# Patient Record
Sex: Male | Born: 1945 | Race: White | Hispanic: No | Marital: Single | Smoking: Former smoker
Health system: Southern US, Community
[De-identification: ages and names within clinical notes are randomized; demographics above are authoritative.]

## PROBLEM LIST (undated history)

## (undated) DIAGNOSIS — N400 Enlarged prostate without lower urinary tract symptoms: Secondary | ICD-10-CM

## (undated) HISTORY — PX: EYE SURGERY: SHX253

## (undated) HISTORY — PX: TONSILLECTOMY: SUR1361

## (undated) HISTORY — PX: CATARACT EXTRACTION: SUR2

---

## 2012-12-12 ENCOUNTER — Encounter (HOSPITAL_COMMUNITY): Payer: Self-pay | Admitting: Pharmacy Technician

## 2012-12-13 ENCOUNTER — Other Ambulatory Visit: Payer: Self-pay

## 2012-12-13 ENCOUNTER — Encounter (HOSPITAL_COMMUNITY)
Admission: RE | Admit: 2012-12-13 | Discharge: 2012-12-13 | Disposition: A | Discharge status: Home / Self Care | Payer: Medicare Other | Source: Ambulatory Visit | Attending: Ophthalmology | Admitting: Ophthalmology

## 2012-12-13 ENCOUNTER — Encounter (HOSPITAL_COMMUNITY): Payer: Self-pay

## 2012-12-13 LAB — BASIC METABOLIC PANEL
CO2: 29 mEq/L (ref 19–32)
Chloride: 101 mEq/L (ref 96–112)
Glucose, Bld: 102 mg/dL — ABNORMAL HIGH (ref 70–99)
Sodium: 139 mEq/L (ref 135–145)

## 2012-12-13 LAB — HEMOGLOBIN AND HEMATOCRIT, BLOOD
HCT: 43.8 % (ref 39.0–52.0)
Hemoglobin: 15.3 g/dL (ref 13.0–17.0)

## 2012-12-13 NOTE — Patient Instructions (Signed)
Your procedure is scheduled on:  Monday, 12/18/12  Report to [facility] at 1000  AM.  Call this number if you have problems the morning of surgery: [phone]   Remember:   Do not eat or drink   After Midnight.  Take these medicines the morning of surgery with A SIP OF WATER: none   Do not wear jewelry, make-up or nail polish.  Do not wear lotions, powders, or perfumes. You may wear deodorant.  Do not bring valuables to the hospital.  Contacts, dentures or bridgework may not be worn into surgery.     Patients discharged the day of surgery will not be allowed to drive home.  Name and phone number of your driver: driver  Special Instructions: Use eye drops as directed.   Please read over the following fact sheets that you were given: Pain Booklet, Anesthesia Post-op Instructions and Care and Recovery After Surgery    Cataract Surgery  A cataract is a clouding of the lens of the eye. When a lens becomes cloudy, vision is reduced based on the degree and nature of the clouding. Surgery may be needed to improve vision. Surgery removes the cloudy lens and usually replaces it with a substitute lens (intraocular lens, IOL). LET YOUR EYE DOCTOR KNOW ABOUT:  Allergies to food or medicine.   Medicines taken including herbs, eyedrops, over-the-counter medicines, and creams.   Use of steroids (by mouth or creams).   Previous problems with anesthetics or numbing medicine.   History of bleeding problems or blood clots.   Previous surgery.   Other health problems, including diabetes and kidney problems.   Possibility of pregnancy, if this applies.  RISKS AND COMPLICATIONS  Infection.   Inflammation of the eyeball (endophthalmitis) that can spread to both eyes (sympathetic ophthalmia).   Poor wound healing.   If an IOL is inserted, it can later fall out of proper position. This is very uncommon.   Clouding of the part of your eye that holds an IOL in place. This is called an  "after-cataract." These are uncommon, but easily treated.  BEFORE THE PROCEDURE  Do not eat or drink anything except small amounts of water for 8 to 12 before your surgery, or as directed by your caregiver.   Unless you are told otherwise, continue any eyedrops you have been prescribed.   Talk to your primary caregiver about all other medicines that you take (both prescription and non-prescription). In some cases, you may need to stop or change medicines near the time of your surgery. This is most important if you are taking blood-thinning medicine.Do not stop medicines unless you are told to do so.   Arrange for someone to drive you to and from the procedure.   Do not put contact lenses in either eye on the day of your surgery.  PROCEDURE There is more than one method for safely removing a cataract. Your doctor can explain the differences and help determine which is best for you. Phacoemulsification surgery is the most common form of cataract surgery.  An injection is given behind the eye or eyedrops are given to make this a painless procedure.   A small cut (incision) is made on the edge of the clear, dome-shaped surface that covers the front of the eye (cornea).   A tiny probe is painlessly inserted into the eye. This device gives off ultrasound waves that soften and break up the cloudy center of the lens. This makes it easier for the cloudy lens  to be removed by suction.   An IOL may be implanted.   The normal lens of the eye is covered by a clear capsule. Part of that capsule is intentionally left in the eye to support the IOL.   Your surgeon may or may not use stitches to close the incision.  There are other forms of cataract surgery that require a larger incision and stiches to close the eye. This approach is taken in cases where the doctor feels that the cataract cannot be easily removed using phacoemulsification. AFTER THE PROCEDURE  When an IOL is implanted, it does not need  care. It becomes a permanent part of your eye and cannot be seen or felt.   Your doctor will schedule follow-up exams to check on your progress.   Review your other medicines with your doctor to see which can be resumed after surgery.   Use eyedrops or take medicine as prescribed by your doctor.  Document Released: 09/30/2011 Document Reviewed: 09/27/2011 [facility] Patient Information 2012 [place], [place].  PATIENT INSTRUCTIONS POST-ANESTHESIA  IMMEDIATELY FOLLOWING SURGERY:  Do not drive or operate machinery for the first twenty four hours after surgery.  Do not make any important decisions for twenty four hours after surgery or while taking narcotic pain medications or sedatives.  If you develop intractable nausea and vomiting or a severe headache please notify your doctor immediately.  FOLLOW-UP:  Please make an appointment with your surgeon as instructed. You do not need to follow up with anesthesia unless specifically instructed to do so.  WOUND CARE INSTRUCTIONS (if applicable):  Keep a dry clean dressing on the anesthesia/puncture wound site if there is drainage.  Once the wound has quit draining you may leave it open to air.  Generally you should leave the bandage intact for twenty four hours unless there is drainage.  If the epidural site drains for more than 36-48 hours please call the anesthesia department.  QUESTIONS?:  Please feel free to call your physician or the hospital operator if you have any questions, and they will be happy to assist you.

## 2012-12-15 MED ORDER — PHENYLEPHRINE HCL 2.5 % OP SOLN
OPHTHALMIC | Status: AC
  Filled 2012-12-15: qty 2

## 2012-12-15 MED ORDER — LIDOCAINE HCL 3.5 % OP GEL
OPHTHALMIC | Status: AC
  Filled 2012-12-15: qty 5

## 2012-12-15 MED ORDER — LIDOCAINE HCL (PF) 1 % IJ SOLN
INTRAMUSCULAR | Status: AC
  Filled 2012-12-15: qty 2

## 2012-12-15 MED ORDER — CYCLOPENTOLATE-PHENYLEPHRINE 0.2-1 % OP SOLN
OPHTHALMIC | Status: AC
  Filled 2012-12-15: qty 2

## 2012-12-15 MED ORDER — TETRACAINE HCL 0.5 % OP SOLN
OPHTHALMIC | Status: AC
  Filled 2012-12-15: qty 2

## 2012-12-15 MED ORDER — NEOMYCIN-POLYMYXIN-DEXAMETH 3.5-10000-0.1 OP OINT
TOPICAL_OINTMENT | OPHTHALMIC | Status: AC
  Filled 2012-12-15: qty 3.5

## 2012-12-18 ENCOUNTER — Encounter (HOSPITAL_COMMUNITY): Payer: Self-pay | Admitting: *Deleted

## 2012-12-18 ENCOUNTER — Ambulatory Visit (HOSPITAL_COMMUNITY)
Admission: RE | Admit: 2012-12-18 | Discharge: 2012-12-18 | Disposition: A | Discharge status: Home Health | Payer: Medicare Other | Source: Ambulatory Visit | Attending: Ophthalmology | Admitting: Ophthalmology

## 2012-12-18 ENCOUNTER — Encounter (HOSPITAL_COMMUNITY): Payer: Self-pay | Admitting: Anesthesiology

## 2012-12-18 ENCOUNTER — Ambulatory Visit (HOSPITAL_COMMUNITY): Payer: Medicare Other | Admitting: Anesthesiology

## 2012-12-18 ENCOUNTER — Encounter (HOSPITAL_COMMUNITY)
Admission: RE | Disposition: A | Discharge status: Home Health | Payer: Self-pay | Source: Ambulatory Visit | Attending: Ophthalmology

## 2012-12-18 DIAGNOSIS — IMO0002 Reserved for concepts with insufficient information to code with codable children: Secondary | ICD-10-CM | POA: Insufficient documentation

## 2012-12-18 DIAGNOSIS — Z01810 Encounter for preprocedural cardiovascular examination: Secondary | ICD-10-CM | POA: Insufficient documentation

## 2012-12-18 DIAGNOSIS — Z01812 Encounter for preprocedural laboratory examination: Secondary | ICD-10-CM | POA: Insufficient documentation

## 2012-12-18 HISTORY — PX: CATARACT EXTRACTION W/PHACO: SHX586

## 2012-12-18 SURGERY — PHACOEMULSIFICATION, CATARACT, WITH IOL INSERTION
Anesthesia: Monitor Anesthesia Care | Site: Eye | Laterality: Right | Wound class: Clean

## 2012-12-18 MED ORDER — CYCLOPENTOLATE-PHENYLEPHRINE 0.2-1 % OP SOLN
1.0000 [drp] | OPHTHALMIC | Status: AC
  Administered 2012-12-18 (×3): 1 [drp] via OPHTHALMIC

## 2012-12-18 MED ORDER — POVIDONE-IODINE 5 % OP SOLN
OPHTHALMIC | Status: DC | PRN
  Administered 2012-12-18: 1 via OPHTHALMIC

## 2012-12-18 MED ORDER — LIDOCAINE HCL 3.5 % OP GEL
1.0000 "application " | Freq: Once | OPHTHALMIC | Status: AC
  Administered 2012-12-18: 1 via OPHTHALMIC

## 2012-12-18 MED ORDER — NEOMYCIN-POLYMYXIN-DEXAMETH 0.1 % OP OINT
TOPICAL_OINTMENT | OPHTHALMIC | Status: DC | PRN
  Administered 2012-12-18: 1 via OPHTHALMIC

## 2012-12-18 MED ORDER — MIDAZOLAM HCL 2 MG/2ML IJ SOLN
INTRAMUSCULAR | Status: AC
  Filled 2012-12-18: qty 2

## 2012-12-18 MED ORDER — BSS IO SOLN
INTRAOCULAR | Status: DC | PRN
  Administered 2012-12-18: 15 mL via INTRAOCULAR

## 2012-12-18 MED ORDER — MIDAZOLAM HCL 2 MG/2ML IJ SOLN
1.0000 mg | INTRAMUSCULAR | Status: DC | PRN
  Administered 2012-12-18: 2 mg via INTRAVENOUS

## 2012-12-18 MED ORDER — NA HYALUR & NA CHOND-NA HYALUR 0.55-0.5 ML IO KIT
PACK | INTRAOCULAR | Status: DC | PRN
  Administered 2012-12-18: 1 via OPHTHALMIC

## 2012-12-18 MED ORDER — EPINEPHRINE HCL 1 MG/ML IJ SOLN
INTRAMUSCULAR | Status: AC
  Filled 2012-12-18: qty 1

## 2012-12-18 MED ORDER — PHENYLEPHRINE HCL 2.5 % OP SOLN
1.0000 [drp] | OPHTHALMIC | Status: AC
  Administered 2012-12-18 (×3): 1 [drp] via OPHTHALMIC

## 2012-12-18 MED ORDER — TRYPAN BLUE 0.06 % OP SOLN
OPHTHALMIC | Status: AC
  Filled 2012-12-18: qty 0.5

## 2012-12-18 MED ORDER — LIDOCAINE HCL (PF) 1 % IJ SOLN
INTRAMUSCULAR | Status: DC | PRN
  Administered 2012-12-18: .6 mL

## 2012-12-18 MED ORDER — EPINEPHRINE HCL 1 MG/ML IJ SOLN
INTRAOCULAR | Status: DC | PRN
  Administered 2012-12-18: 12:00:00

## 2012-12-18 MED ORDER — LIDOCAINE 3.5 % OP GEL OPTIME - NO CHARGE
OPHTHALMIC | Status: DC | PRN
  Administered 2012-12-18: 1 [drp] via OPHTHALMIC

## 2012-12-18 MED ORDER — LACTATED RINGERS IV SOLN
INTRAVENOUS | Status: DC
  Administered 2012-12-18: 1000 mL via INTRAVENOUS

## 2012-12-18 MED ORDER — TETRACAINE HCL 0.5 % OP SOLN
1.0000 [drp] | OPHTHALMIC | Status: AC
  Administered 2012-12-18 (×3): 1 [drp] via OPHTHALMIC

## 2012-12-18 SURGICAL SUPPLY — 33 items
CAPSULAR TENSION RING-AMO (OPHTHALMIC RELATED) IMPLANT
CLOTH BEACON ORANGE TIMEOUT ST (SAFETY) ×2 IMPLANT
EYE SHIELD UNIVERSAL CLEAR (GAUZE/BANDAGES/DRESSINGS) ×2 IMPLANT
GLOVE BIO SURGEON STRL SZ 6.5 (GLOVE) IMPLANT
GLOVE BIOGEL PI IND STRL 6.5 (GLOVE) ×1 IMPLANT
GLOVE BIOGEL PI IND STRL 7.0 (GLOVE) IMPLANT
GLOVE BIOGEL PI IND STRL 7.5 (GLOVE) IMPLANT
GLOVE BIOGEL PI INDICATOR 6.5 (GLOVE) ×1
GLOVE BIOGEL PI INDICATOR 7.0 (GLOVE)
GLOVE BIOGEL PI INDICATOR 7.5 (GLOVE)
GLOVE ECLIPSE 6.5 STRL STRAW (GLOVE) IMPLANT
GLOVE ECLIPSE 7.0 STRL STRAW (GLOVE) IMPLANT
GLOVE ECLIPSE 7.5 STRL STRAW (GLOVE) IMPLANT
GLOVE EXAM NITRILE LRG STRL (GLOVE) IMPLANT
GLOVE EXAM NITRILE MD LF STRL (GLOVE) IMPLANT
GLOVE SKINSENSE NS SZ6.5 (GLOVE)
GLOVE SKINSENSE NS SZ7.0 (GLOVE)
GLOVE SKINSENSE NS SZ7.5 (GLOVE) ×1
GLOVE SKINSENSE STRL SZ6.5 (GLOVE) IMPLANT
GLOVE SKINSENSE STRL SZ7.0 (GLOVE) IMPLANT
GLOVE SKINSENSE STRL SZ7.5 (GLOVE) ×1 IMPLANT
KIT VITRECTOMY (OPHTHALMIC RELATED) IMPLANT
PAD ARMBOARD 7.5X6 YLW CONV (MISCELLANEOUS) ×2 IMPLANT
PROC W NO LENS (INTRAOCULAR LENS)
PROC W SPEC LENS (INTRAOCULAR LENS)
PROCESS W NO LENS (INTRAOCULAR LENS) IMPLANT
PROCESS W SPEC LENS (INTRAOCULAR LENS) IMPLANT
RING MALYGIN (MISCELLANEOUS) IMPLANT
SIGHTPATH CAT PROC W REG LENS (Ophthalmic Related) ×2 IMPLANT
SYR TB 1ML LL NO SAFETY (SYRINGE) ×2 IMPLANT
TAPE CLOTH SOFT 2X10 (GAUZE/BANDAGES/DRESSINGS) ×2 IMPLANT
VISCOELASTIC ADDITIONAL (OPHTHALMIC RELATED) IMPLANT
WATER STERILE IRR 250ML POUR (IV SOLUTION) ×2 IMPLANT

## 2012-12-18 NOTE — Anesthesia Preprocedure Evaluation (Signed)
Anesthesia Evaluation  Patient identified by MRN, date of birth, ID band Patient awake    Reviewed: Allergy & Precautions, H&P , NPO status , Patient's Chart, lab work & pertinent test results  Airway Mallampati: III    Mouth opening: Limited Mouth Opening  Dental  (+) Edentulous Upper and Edentulous Lower   Pulmonary neg pulmonary ROS, former smoker,  breath sounds clear to auscultation        Cardiovascular negative cardio ROS  Rhythm:Regular Rate:Normal     Neuro/Psych    GI/Hepatic   Endo/Other    Renal/GU      Musculoskeletal   Abdominal   Peds  Hematology   Anesthesia Other Findings   Reproductive/Obstetrics                           Anesthesia Physical Anesthesia Plan  ASA: I  Anesthesia Plan: MAC   Post-op Pain Management:    Induction: Intravenous  Airway Management Planned: Nasal Cannula  Additional Equipment:   Intra-op Plan:   Post-operative Plan:   Informed Consent: I have reviewed the patients History and Physical, chart, labs and discussed the procedure including the risks, benefits and alternatives for the proposed anesthesia with the patient or authorized representative who has indicated his/her understanding and acceptance.     Plan Discussed with:   Anesthesia Plan Comments:         Anesthesia Quick Evaluation

## 2012-12-18 NOTE — Transfer of Care (Signed)
Immediate Anesthesia Transfer of Care Note  Patient: Cory Porter  Procedure(s) Performed: Procedure(s) (LRB): CATARACT EXTRACTION PHACO AND INTRAOCULAR LENS PLACEMENT (IOC) (Right)  Patient Location: Shortstay  Anesthesia Type: MAC  Level of Consciousness: awake  Airway & Oxygen Therapy: Patient Spontanous Breathing   Post-op Assessment: Report given to PACU RN, Post -op Vital signs reviewed and stable and Patient moving all extremities  Post vital signs: Reviewed and stable  Complications: No apparent anesthesia complications

## 2012-12-18 NOTE — Anesthesia Postprocedure Evaluation (Signed)
  Anesthesia Post-op Note  Patient: Cory Porter  Procedure(s) Performed: Procedure(s) (LRB): CATARACT EXTRACTION PHACO AND INTRAOCULAR LENS PLACEMENT (IOC) (Right)  Patient Location:  Short Stay  Anesthesia Type: MAC  Level of Consciousness: awake  Airway and Oxygen Therapy: Patient Spontanous Breathing  Post-op Pain: none  Post-op Assessment: Post-op Vital signs reviewed, Patient's Cardiovascular Status Stable, Respiratory Function Stable, Patent Airway, No signs of Nausea or vomiting and Pain level controlled  Post-op Vital Signs: Reviewed and stable  Complications: No apparent anesthesia complications

## 2012-12-18 NOTE — H&P (Signed)
I have reviewed the H&P, the patient was re-examined, and I have identified no interval changes in medical condition and plan of care since the history and physical of record  

## 2012-12-18 NOTE — Anesthesia Procedure Notes (Signed)
Procedure Name: MAC Date/Time: 12/18/2012 11:28 AM Performed by: Franco Nones Pre-anesthesia Checklist: Patient identified, Emergency Drugs available, Suction available, Timeout performed and Patient being monitored Patient Re-evaluated:Patient Re-evaluated prior to inductionOxygen Delivery Method: Nasal Cannula

## 2012-12-18 NOTE — Brief Op Note (Signed)
Pre-Op Dx: Cataract OD Post-Op Dx: Cataract OD Surgeon: Gwendolyn Mclees Anesthesia: Topical with MAC Surgery: Cataract Extraction with Intraocular lens Implant OD Implant: B&L enVista Specimen: None Complications: None 

## 2012-12-19 ENCOUNTER — Other Ambulatory Visit (HOSPITAL_COMMUNITY): Payer: Self-pay

## 2012-12-19 ENCOUNTER — Encounter (HOSPITAL_COMMUNITY): Payer: Self-pay | Admitting: Ophthalmology

## 2012-12-19 NOTE — Op Note (Signed)
NAMEQUASEAN, FRYE                 ACCOUNT NO.:  [id number]  MEDICAL RECORD NO.:  [id number]  LOCATION:  [facility]                          FACILITY:  [facility]  PHYSICIAN:  Susanne Greenhouse, MD       DATE OF BIRTH:  [birth year]  DATE OF PROCEDURE:  12/18/2012 DATE OF DISCHARGE:  12/18/2012                              OPERATIVE REPORT   PREOPERATIVE DIAGNOSIS:  Matured cataract, right eye, diagnosis code 366.17.  POSTOPERATIVE DIAGNOSIS:  Matured cataract, right eye, diagnosis code 366.17.  PROCEDURE:  Phacoemulsification with posterior chamber intraocular lens implantation, right eye.  SURGEON:  Bonne Dolores. Farhad Burleson, MD  ANESTHESIA:  Topical with monitored anesthesia care and IV sedation.  DESCRIPTION OF THE PROCEDURE:  In the preoperative holding area, dilating drops and Viscous lidocaine were placed into the right eye. The patient was then brought to the operating room where he was prepped and draped.  Beginning with a 75 blade, a paracentesis was made at the surgeon's 2 o'clock position.  The anterior chamber was then filled with a 1% nonpreserved lidocaine solution.  This was followed by filling the anterior chamber with VisionBlue to stay in the anterior capsule due to a poor red reflex.  The VisionBlue was displaced from the anterior chamber with balanced salt solution on a fine cannula.  The anterior chamber was then filled with Viscoat.  A 2.4-mm keratome blade was then used to make a clear corneal incision at the temporal limbus.  A bent cystotome needle and Utrata forceps were used to create a continuous tear capsulotomy.  Hydrodissection was performed with balanced salt solution on a fine cannula.  The lens nucleus was then removed using phacoemulsification in a quadrant cracking technique.  Residual cortex was removed with the irrigation and aspiration handpiece.  The capsular bag and anterior chamber were refilled with Provisc and the posterior chamber intraocular lens was placed  into the capsular bag without difficulty using the lens injecting system.  The Provisc was removed from the capsular bag and anterior chamber with the irrigation and aspiration handpiece.  Stromal hydration of the main incision and paracentesis ports was performed with balanced salt solution and a fine cannula.  The wounds were tested for leak, which were negative.  The patient tolerated the procedure well.  There were no operative complications and he was returned to the recovery area in satisfactory condition.  No surgical specimens.  Prosthetic device used is a Theme park manager, model EnVista, model number MX60, power of 23.0, serial number is [id number].          ______________________________ Susanne Greenhouse, MD     KEH/MEDQ  D:  12/18/2012  T:  12/19/2012  Job:  [id number]

## 2016-12-12 ENCOUNTER — Encounter (HOSPITAL_COMMUNITY): Payer: Self-pay | Admitting: General Practice

## 2016-12-12 ENCOUNTER — Observation Stay (HOSPITAL_COMMUNITY)
Admission: AD | Admit: 2016-12-12 | Discharge: 2016-12-12 | Disposition: A | Discharge status: Home / Self Care | Payer: Medicare Other | Source: Other Acute Inpatient Hospital | Attending: Family Medicine | Admitting: Family Medicine

## 2016-12-12 ENCOUNTER — Observation Stay (HOSPITAL_BASED_OUTPATIENT_CLINIC_OR_DEPARTMENT_OTHER): Payer: Medicare Other

## 2016-12-12 ENCOUNTER — Telehealth: Payer: Self-pay | Admitting: Internal Medicine

## 2016-12-12 DIAGNOSIS — R739 Hyperglycemia, unspecified: Secondary | ICD-10-CM | POA: Diagnosis present

## 2016-12-12 DIAGNOSIS — R079 Chest pain, unspecified: Secondary | ICD-10-CM | POA: Diagnosis present

## 2016-12-12 DIAGNOSIS — Z91040 Latex allergy status: Secondary | ICD-10-CM | POA: Diagnosis not present

## 2016-12-12 DIAGNOSIS — Z87891 Personal history of nicotine dependence: Secondary | ICD-10-CM

## 2016-12-12 DIAGNOSIS — R0789 Other chest pain: Principal | ICD-10-CM | POA: Insufficient documentation

## 2016-12-12 DIAGNOSIS — N400 Enlarged prostate without lower urinary tract symptoms: Secondary | ICD-10-CM | POA: Diagnosis present

## 2016-12-12 DIAGNOSIS — I252 Old myocardial infarction: Secondary | ICD-10-CM | POA: Insufficient documentation

## 2016-12-12 HISTORY — DX: Benign prostatic hyperplasia without lower urinary tract symptoms: N40.0

## 2016-12-12 LAB — TROPONIN I: Troponin I: <0.03 ng/mL (ref <0.03)

## 2016-12-12 LAB — LIPID PANEL
CHOL/HDL RATIO: 5.2 ratio
CHOLESTEROL: 202 mg/dL — AB (ref 0–200)
HDL: 39 mg/dL — ABNORMAL LOW (ref >40)
LDL CALC: 150 mg/dL — AB (ref 0–99)
Triglycerides: 66 mg/dL (ref <150)
VLDL: 13 mg/dL (ref 0–40)

## 2016-12-12 LAB — NM MYOCAR MULTI W/SPECT W/WALL MOTION / EF
CHL CUP MPHR: 149 {beats}/min
CHL CUP RESTING HR STRESS: 76 {beats}/min
CHL RATE OF PERCEIVED EXERTION: 15
CSEPEW: 5.8 METS
Exercise duration (min): 4 min
Exercise duration (sec): 0 s
Peak HR: 151 {beats}/min
Percent HR: 101 %

## 2016-12-12 LAB — TSH: TSH: 2.764 u[IU]/mL (ref 0.350–4.500)

## 2016-12-12 MED ORDER — ASPIRIN 325 MG PO TABS
325.0000 mg | ORAL_TABLET | Freq: Every day | ORAL | Status: DC
  Administered 2016-12-12: 325 mg via ORAL
  Filled 2016-12-12: qty 1

## 2016-12-12 MED ORDER — TECHNETIUM TC 99M TETROFOSMIN IV KIT
30.0000 | PACK | Freq: Once | INTRAVENOUS | Status: AC | PRN
  Administered 2016-12-12: 30 via INTRAVENOUS

## 2016-12-12 MED ORDER — DIPHENHYDRAMINE-APAP (SLEEP) 25-500 MG PO TABS: 1.0000 | ORAL_TABLET | Freq: Every day | ORAL | Status: DC

## 2016-12-12 MED ORDER — REGADENOSON 0.4 MG/5ML IV SOLN
INTRAVENOUS | Status: AC
  Filled 2016-12-12: qty 5

## 2016-12-12 MED ORDER — REGADENOSON 0.4 MG/5ML IV SOLN
0.4000 mg | Freq: Once | INTRAVENOUS | Status: DC
  Filled 2016-12-12: qty 5

## 2016-12-12 MED ORDER — ONDANSETRON HCL 4 MG/2ML IJ SOLN: 4.0000 mg | Freq: Four times a day (QID) | INTRAMUSCULAR | Status: DC | PRN

## 2016-12-12 MED ORDER — ENOXAPARIN SODIUM 40 MG/0.4ML ~~LOC~~ SOLN
40.0000 mg | SUBCUTANEOUS | Status: DC
  Administered 2016-12-12: 40 mg via SUBCUTANEOUS
  Filled 2016-12-12: qty 0.4

## 2016-12-12 MED ORDER — ACETAMINOPHEN 325 MG PO TABS: 650.0000 mg | ORAL_TABLET | ORAL | Status: DC | PRN

## 2016-12-12 MED ORDER — SODIUM CHLORIDE 0.9 % IV SOLN: INTRAVENOUS | Status: DC

## 2016-12-12 MED ORDER — DIPHENHYDRAMINE HCL 25 MG PO CAPS: 25.0000 mg | ORAL_CAPSULE | Freq: Every day | ORAL | Status: DC

## 2016-12-12 NOTE — Consult Note (Addendum)
    Chief Complaint/Reason for Consult: chest pain   PCP:  Default, Provider, MD Primary Cardiologist:N/A  HPI:  71 year old with no history of CAD [place].  Acutely tonight had acute onset of back/chest pain central at rest without radiation.  Associated with nausea, diaphoresis, shortness of breath.  Symptoms improved in 30 minutes.  [facility] and ECG reported as within normal limits other than possible repolarization. Troponin negative x2.  [facility].    No known CAD, possibly stress test [place] [facility] [place] [place] ago (unclear) which was normal.  No previous cath or echo.  Doesn't go to the doctor regularly.  No home medications other than Flomax.    On questioning, chest pain free.  He denies any recent minor chest pains or symptoms with exertion.  Denies reflux symptoms. No edema, SOB, bleeding.    Remote smoking history and family history of early CAD in brother.  Rhunette CroftMildred (girlfriend) and daughter at bedside and deny any other symptoms, changes.  History reviewed. No pertinent past medical history. BPH  Past Surgical History:  Procedure Laterality Date  . CATARACT EXTRACTION     left-[place]  . CATARACT EXTRACTION W/PHACO Right 12/18/2012   Procedure: CATARACT EXTRACTION PHACO AND INTRAOCULAR LENS PLACEMENT (IOC);  Surgeon: Gemma PayorKerry Hunt, MD;  Location: [facility];  Service: Ophthalmology;  Laterality: Right;  CDE:37.10  . EYE SURGERY     cataract  . TONSILLECTOMY      Early CAD in brother  Social History:  reports that he has quit smoking. His smoking use included Cigarettes. He has a 30.00 pack-year smoking history. He quit smokeless tobacco use about 34 years ago. He reports that he does not drink alcohol or use drugs.  Allergies:  Allergies  Allergen Reactions  . Latex Rash    No current facility-administered medications on file prior to encounter.    Current  Outpatient Prescriptions on File Prior to Encounter  Medication Sig Dispense Refill  . diphenhydramine-acetaminophen (TYLENOL PM) 25-500 MG TABS Take 1 tablet by mouth at bedtime.      ECG/TELE: Tele: NSR nl; ECG sinus with nl axis, intervals, segments.  Lowish voltage  ROS: As above. Otherwise, review of systems is negative unless per above HPI  Vitals:   12/12/16 0605  BP: 133/77  Pulse: 70  Resp: 19  Temp: 98.4 F (36.9 C)  TempSrc: Oral  SpO2: 98%  Weight: 87 kg (191 lb 14.4 oz)  Height: 5\' 11"  (1.803 m)   Wt Readings from Last 10 Encounters:  12/12/16 87 kg (191 lb 14.4 oz)  12/13/12 94.3 kg (208 lb)    PE:  General: No acute distress HEENT: Atraumatic, EOMI, mucous membranes moist CV: RRR, 1/6 SEM, no gallops. No JVD at 45 degrees. No HJR. Respiratory: Clear, no crackles. Normal work of breathing ABD: Non-distended and non-tender. No palpable organomegaly.  Extremities: 2+ radial pulses bilaterally. No edema. Neuro/Psych: CN grossly intact, alert and oriented  Assessment/Plan Chest Pain BPH  Recommendations: Chest pain story with some classic features and previous tobacco abuse and early CAD in brother.  Reasonable to pursue stress testing pending results from labs/tests.  - Cycle enzymes, Telemetry, continue ASA 81 mg daily , daily ECG - Check lipids, TSH, A1c to better understand risk - Full echocardiogram - Heparin if repeat symptoms  Greig CastillaGregory Thomas Means  MD 12/12/2016, 6:31 AM

## 2016-12-12 NOTE — H&P (Signed)
Transfer from [facility] Mr. Cory IdolFoley is a 71 year old male with no significant known pmh who presented with acute onset of CP around 10:30 PM last night. Symptoms lasted 30 minutes and self resolved. Vital signs stable. Initial troponins 2 negative and EKG unchanged. Transferring for need of stress testing and cardiology eval. Cardiology consulted prior to transport talked with fellow on-call. Admitted observation to a telemetry bed.

## 2016-12-12 NOTE — H&P (Signed)
History and Physical    Cory Porter XLK:[id number] DOB: June 04, 1946 DOA: 12/12/2016   PCP: Default, Provider, MD   Patient coming from/Resides with: Private residence/this with wife  Admission status: Observation/telemetry -it may be medically necessary to stay a minimum 2 midnights to rule out impending and/or unexpected changes in physiologic status that may differ from initial evaluation performed in the ER and/or at time of admission therefore consider reevaluation of admission status in 24 hours.   Chief Complaint: Chest pain  HPI: Cory Porter is a 71 y.o. male with medical history significant for tobacco abuse, BPH and also family history of MI. Patient reports that around 9 AM his back began hurting and he leaned forward and developed diffuse anterior chest pain that continued to radiate back and forth between the back and chest. He initially thought this was heartburn but was unable to relieve the symptoms. Symptoms were associated with diaphoresis and lasted 15 minutes and resolved spontaneously. Patient reports that he is typically very active and walks daily including walking his dog and has not had chest pain, increasing fatigue or shortness of breath with activity.  ED Course: [facility]) Vital Signs: BP 133/69   Pulse 61   Temp 98.3 F  (Oral)   Resp 19   Ht 5\' 11"  (1.803 m)   Wt 87 kg (191 lb 14.4 oz)   SpO2 98%   BMI 26.76 kg/m  PCXR: neg Lab data: WBC 7600, hemoglobin 14.1, platelets 190,000, normal differential, BNP 44, d-dimer 0.34, glucose 174, BUN 14, creatinine 0.98, LFTs normal, sodium 142, potassium 4, chloride 103, troponin less than 0.012 collections Medications and treatments: Nitroglycerin paste 1/2 inch 1, Tylenol 975 mg 1, aspirin 162 mg 1  Review of Systems:  In addition to the HPI above,  No Fever-chills, myalgias or other constitutional symptoms No Headache, changes with Vision or hearing, new weakness, tingling, numbness in any  extremity, dizziness, dysarthria or word finding difficulty, gait disturbance or imbalance, tremors or seizure activity No problems swallowing food or Liquids, indigestion/reflux, choking or coughing while eating, abdominal pain with or after eating No Cough or Shortness of Breath, palpitations, orthopnea or DOE No Abdominal pain, N/V, melena,hematochezia, dark tarry stools, constipation No dysuria, malodorous urine, hematuria or flank pain No new skin rashes, lesions, masses or bruises, No new joint pains, aches, swelling or redness No recent unintentional weight gain or loss No polyuria, polydypsia or polyphagia   Past Medical History:  Diagnosis Date  . BPH (benign prostatic hyperplasia)     Past Surgical History:  Procedure Laterality Date  . CATARACT EXTRACTION     left-  . CATARACT EXTRACTION W/PHACO Right 12/18/2012   Procedure: CATARACT EXTRACTION PHACO AND INTRAOCULAR LENS PLACEMENT (IOC);  Surgeon: Gemma Payor, MD;  Location: [facility];  Service: Ophthalmology;  Laterality: Right;  CDE:37.10  . EYE SURGERY     cataract  . TONSILLECTOMY      Social History   Social History  . Marital status: Single    Spouse name: N/A  . Number of children: N/A  . Years of education: N/A   Occupational History  . Not on file.   Social History Main Topics  . Smoking status: Former Smoker    Packs/day: 2.00    Years: 15.00    Types: Cigarettes  . Smokeless tobacco: Former Neurosurgeon    Quit date: 12/13/1989  . Alcohol use No  . Drug use: No  . Sexual activity: Yes    Birth control/  protection: None   Other Topics Concern  . Not on file   Social History Narrative  . 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 Latex Rash    Family History  Problem Relation Age of Onset  . CAD Father 3262  . Heart  attack Father   . CAD Brother 8851     Prior to Admission medications   Medication Sig Start Date End Date Taking? Authorizing Provider  diphenhydramine-acetaminophen (TYLENOL PM) 25-500 MG TABS Take 1 tablet by mouth at bedtime.    Historical Provider, MD    Physical Exam: Vitals:   12/12/16 0605  BP: 133/77  Pulse: 70  Resp: 19  Temp: 98.4 F (36.9 C)  TempSrc: Oral  SpO2: 98%  Weight: 87 kg (191 lb 14.4 oz)  Height: 5\' 11"  (1.803 m)      Constitutional: NAD, calm, comfortable Eyes: PERRL, lids and conjunctivae normal ENMT: Mucous membranes are moist. Posterior pharynx clear of any exudate or lesions.Normal dentition.  Neck: normal, supple, no masses, no thyromegaly Respiratory: clear to auscultation bilaterally, no wheezing, no crackles. Normal respiratory effort. No accessory muscle use.  Cardiovascular: Regular rate and rhythm, no murmurs / rubs / gallops. No extremity edema. 2+ pedal pulses. No carotid bruits.  Abdomen: no tenderness, no masses palpated. No hepatosplenomegaly. Bowel sounds positive.  Musculoskeletal: no clubbing / cyanosis. No joint deformity upper and lower extremities. Good ROM, no contractures. Normal muscle tone.  Skin: no rashes, lesions, ulcers. No induration Neurologic: CN 2-12 grossly intact. Sensation intact, DTR normal. Strength 5/5 x all 4 extremities.  Psychiatric: Normal judgment and insight. Alert and oriented x 3. Normal mood.    Labs on Admission: I have personally reviewed following labs and imaging studies  CBC: No results for input(s): WBC, NEUTROABS, HGB, HCT, MCV, PLT in the last 168 hours. Basic Metabolic Panel: No results for input(s): NA, K, CL, CO2, GLUCOSE, BUN, CREATININE, CALCIUM, MG, PHOS in the last 168 hours. GFR: CrCl cannot be calculated (Patient's most recent lab result is older than the maximum 21 days allowed.). Liver Function Tests: No results for input(s): AST, ALT, ALKPHOS, BILITOT, PROT, ALBUMIN in the last  168 hours. No results for input(s): LIPASE, AMYLASE in the last 168 hours. No results for input(s): AMMONIA in the last 168 hours. Coagulation Profile: No results for input(s): INR, PROTIME in the last 168 hours. Cardiac Enzymes: No results for input(s): CKTOTAL, CKMB, CKMBINDEX, TROPONINI in the last 168 hours. BNP (last 3 results) No results for input(s): PROBNP in the last 8760 hours. HbA1C: No results for input(s): HGBA1C in the last 72 hours. CBG: No results for input(s): GLUCAP in the last 168 hours. Lipid Profile: No results for input(s): CHOL, HDL, LDLCALC, TRIG, CHOLHDL, LDLDIRECT in the last 72 hours. Thyroid Function Tests: No results for input(s): TSH, T4TOTAL, FREET4, T3FREE, THYROIDAB in the last 72 hours. Anemia Panel: No results for input(s): VITAMINB12, FOLATE, FERRITIN, TIBC, IRON, RETICCTPCT in the last 72 hours. Urine analysis: No results found for: COLORURINE, APPEARANCEUR, LABSPEC, PHURINE, GLUCOSEU, HGBUR, BILIRUBINUR, KETONESUR, PROTEINUR, UROBILINOGEN, NITRITE, LEUKOCYTESUR Sepsis Labs: @LABRCNTIP (procalcitonin:4,lacticidven:4) )No results found for this or any previous visit (from the past 240 hour(s)).   Radiological Exams on Admission: No results found.  EKG: (Independently reviewed) sinus rhythm with her to cardiac ventricular rate 56 bpm, QTC 419 ms,  normal R-wave progression, loss of R waves in leads 3 and aVF, slightly widened QRS without definitive interventricular conduction delay, no definitive ischemic changes; unchanged from previous EKG from 2014  Assessment/Plan Principal Problem:   Chest pain -Results with solitary episode of chest pain associated with diaphoresis, onset at rest with spontaneous improvement -Heart score equals 3 -Family history of heart disease in father/nonsmoker and Brother/smoker -Reported negative stress test to be a system about 5 years ago -Troponin 2 negative-repeat at least once -Have discussed with cardiology PA  (Bhagat) and plans are to proceed with NM Lexiscan stress test-cardiology has seen in consultation -Echocardiogram -Lipid panel -Currently chest pain-free and has had no recurrences of chest pain since initial presentation to outside ER therefore no indication to begin heparin infusion  Active Problems:   Acute hyperglycemia -Hemoglobin A1c    BPH (benign prostatic hyperplasia) -Reports takes Flomax at home    Former tobacco use -Cessation 1991      DVT prophylaxis: Lovenox Code Status: Full  Family Communication: Wife and daughter at bedside Disposition Plan: Discharge to preadmission home environment when medically stable Consults called: Cardiology/Nishan    Cory Porter L. ANP-BC [facility] Pager [phone]   If 7PM-7AM, please contact night-coverage [email or web address] Password [facility]  12/12/2016, 7:38 AM

## 2016-12-12 NOTE — Discharge Summary (Signed)
Physician Discharge Summary  Cory Deutscherdwin D Tornow [id number] DOB: 10-30-1945 DOA: 12/12/2016  PCP: Default, Provider, MD  Admit date: 12/12/2016 Discharge date: 12/12/2016  Time spent: <30 minutes  Recommendations for Outpatient Follow-up:  1. Please schedule post hospital [facility] 2. You had an abnormal lipid panel with cholesterol 202, HDL cholesterol 39 and LDL cholesterol 150 that may require the initiation of medications. These results need to be discussed with your primary care provider 3. Patient presented with atypical rest chest pain with a negative nuclear stress test. If pain recurs patient may need additional testing for likely gastrointestinal etiology.   Discharge Diagnoses:  Principal Problem:   Chest pain Active Problems:   Acute hyperglycemia   BPH (benign prostatic hyperplasia)   Former tobacco use   Discharge Condition: Stable  Diet recommendation: Heart healthy  Filed Weights   12/12/16 0605  Weight: 87 kg (191 lb 14.4 oz)    History of present illness:  Cory Porter is a 71 y.o. male with medical history significant for tobacco abuse, BPH and also family history of MI. Patient reports that around 9 AM his back began hurting and he leaned forward and developed diffuse anterior chest pain that continued to radiate back and forth between the back and chest. He initially thought this was heartburn but was unable to relieve the symptoms. Symptoms were associated with diaphoresis and lasted 15 minutes and resolved spontaneously. Patient reports that he is typically very active and walks daily including walking his dog and has not had chest pain, increasing fatigue or shortness of breath with activity.  Hospital Course:  Principal Problem:   Chest pain -Presented with solitary episode of chest pain associated with diaphoresis, onset at rest with spontaneous improvement -Heart score equals 3 -Family  history of heart disease in father/nonsmoker and Brother/smoker -Reported negative stress test to be a system about 5 years ago -Troponin I negative for for readings -Cardiology consulted; Myoview stress test recommended with subsequent nonischemic results -Echocardiogram ordered but canceled in setting of negative Myoview stress test -Lipid panel abnormal as follows: Cholesterol 202, HDL cholesterol 39, LDL cholesterol 150 and triglycerides 66. -As remained chest pain-free-has had no recurrences of chest pain since initial presentation to outside ER  -TSH was 2.764  Active Problems:   Acute hyperglycemia -Hemoglobin A1c obtained and pending results at time of discharge    BPH (benign prostatic hyperplasia) -Resume Flomax at home    Former tobacco use -Cessation since 1991  Procedures: None  Consultations:  Cardiology/Dr. Eden EmmsNishan  Discharge Exam: Vitals:   12/12/16 0919 12/12/16 0922  BP: (!) 164/76 (!) 155/76  Pulse: (!) 118 (!) 109  Resp:    Temp:     Constitutional: NAD, calm, comfortable Eyes: PERRL, lids and conjunctivae normal ENMT: Mucous membranes are moist. Posterior pharynx clear of any exudate or lesions.Normal dentition.  Neck: normal, supple, no masses, no thyromegaly Respiratory: clear to auscultation bilaterally, no wheezing, no crackles. Normal respiratory effort. No accessory muscle use.  Cardiovascular: Regular rate and rhythm, no murmurs / rubs / gallops. No extremity edema. 2+ pedal pulses. No carotid bruits.  Abdomen: no tenderness, no masses palpated. No hepatosplenomegaly. Bowel sounds positive.  Musculoskeletal: no clubbing / cyanosis. No joint deformity upper and lower extremities. Good ROM, no contractures. Normal muscle tone.  Skin: no rashes, lesions, ulcers. No induration Neurologic: CN 2-12 grossly intact. Sensation intact, DTR normal. Strength 5/5 x all 4 extremities.  Psychiatric: Normal judgment and  insight. Alert and oriented x 3. Normal  mood.     Discharge Instructions   Discharge Instructions    Call MD for:    Complete by:  As directed    Chest pain or abdomen pain   Call MD for:  difficulty breathing, headache or visual disturbances    Complete by:  As directed    Call MD for:  extreme fatigue    Complete by:  As directed    Call MD for:  persistant nausea and vomiting    Complete by:  As directed    Diet - low sodium heart healthy    Complete by:  As directed    Increase activity slowly    Complete by:  As directed      Current Discharge Medication List    CONTINUE these medications which have NOT CHANGED   Details  diphenhydramine-acetaminophen (TYLENOL PM) 25-500 MG TABS Take 1 tablet by mouth at bedtime.       Allergies  Allergen Reactions  . Latex Rash      The results of significant diagnostics from this hospitalization (including imaging, microbiology, ancillary and laboratory) are listed below for reference.    Significant Diagnostic Studies: Nm Myocar Multi W/spect W/wall Motion / Ef  Result Date: 12/12/2016 CLINICAL DATA:  Chest pain EXAM: MYOCARDIAL IMAGING WITH SPECT (REST AND EXERCISE) GATED LEFT VENTRICULAR WALL MOTION STUDY LEFT VENTRICULAR EJECTION FRACTION TECHNIQUE: Standard myocardial SPECT imaging was performed after resting intravenous injection of 10 mCi Tc-75m tetrofosmin. Subsequently, exercise tolerance test was performed by the patient under the supervision of the Cardiology staff. At peak-stress, 30 mCi Tc-99m tetrofosmin was injected intravenously and standard myocardial SPECT imaging was performed. Quantitative gated imaging was also performed to evaluate left ventricular wall motion, and estimate left ventricular ejection fraction. COMPARISON:  None FINDINGS: Perfusion: Decreased perfusion at the inferior wall LEFT ventricle at stress, unchanged at rest, potentially related to diaphragmatic attenuation. No other perfusion defects identified. Wall Motion: Normal left  ventricular wall motion. No left ventricular dilation. Left Ventricular Ejection Fraction: 48 % End diastolic volume 89 ml End systolic volume 46 ml IMPRESSION: 1. No reversible ischemia or infarction. 2. Normal left ventricular wall motion. 3. Left ventricular ejection fraction 48% 4. Non invasive risk stratification*: Low *2012 Appropriate Use Criteria for Coronary Revascularization Focused Update: J Am Coll Cardiol. 2012;59(9):857-881. http://content.dementiazones.com.aspx?articleid=[email or web address] Electronically Signed   By: Ulyses Southward M.D.   On: 12/12/2016 10:22    Microbiology: No results found for this or any previous visit (from the past 240 hour(s)).   Labs: Basic Metabolic Panel: No results for input(s): NA, K, CL, CO2, GLUCOSE, BUN, CREATININE, CALCIUM, MG, PHOS in the last 168 hours. Liver Function Tests: No results for input(s): AST, ALT, ALKPHOS, BILITOT, PROT, ALBUMIN in the last 168 hours. No results for input(s): LIPASE, AMYLASE in the last 168 hours. No results for input(s): AMMONIA in the last 168 hours. CBC: No results for input(s): WBC, NEUTROABS, HGB, HCT, MCV, PLT in the last 168 hours. Cardiac Enzymes:  Recent Labs Lab 12/12/16 0643 12/12/16 1020  TROPONINI <0.03 <0.03   BNP: BNP (last 3 results) No results for input(s): BNP in the last 8760 hours.  ProBNP (last 3 results) No results for input(s): PROBNP in the last 8760 hours.  CBG: No results for input(s): GLUCAP in the last 168 hours.     Signed:  Fowler Antos L. ANP [facility] 12/12/2016, 11:32 AM

## 2016-12-12 NOTE — Progress Notes (Signed)
Patient presented for Exercise Myoview.  Tolerated procedure well. Pending final stress imaging result.  

## 2016-12-12 NOTE — Progress Notes (Signed)
   12/12/16 0605  Vitals  Temp 98.4 F (36.9 C)  Temp Source Oral  BP 133/77  MAP (mmHg) 94  Pulse Rate 70  ECG Heart Rate 70  Resp 19  Oxygen Therapy  SpO2 98 %  O2 Device Room Air  Height and Weight  Height 5\' 11"  (1.803 m)  Weight 87 kg (191 lb 14.4 oz)  Type of Scale Used Standing  Type of Weight Actual  BSA (Calculated - sq m) 2.09 sq meters  BMI (Calculated) 26.8  Weight in (lb) to have BMI = 25 178.9  Admitted pt to rm 3W38 from outside hospital, pt alert and oriented, denied chest pain at this time, oriented to room, call bell placed within reach, admitting MD notified. Pt placed on cardiac monitor, CCMD made aware. Cardiologist at bedside.

## 2016-12-12 NOTE — Telephone Encounter (Signed)
error 

## 2016-12-12 NOTE — Progress Notes (Signed)
    Subjective:  Denies SSCP, palpitations or Dyspnea Only had one 15 minute episode of SSCP Saturday night   Objective:  Vitals:   12/12/16 0605  BP: 133/77  Pulse: 70  Resp: 19  Temp: 98.4 F (36.9 C)  TempSrc: Oral  SpO2: 98%  Weight: 191 lb 14.4 oz (87 kg)  Height: 5\' 11"  (1.803 m)    Intake/Output from previous day: No intake or output data in the 24 hours ending 12/12/16 0755  Physical Exam: Affect appropriate Healthy:  appears stated age HEENT: normal Neck supple with no adenopathy JVP normal no bruits no thyromegaly Lungs clear with no wheezing and good diaphragmatic motion Heart:  S1/S2 no murmur, no rub, gallop or click PMI normal Abdomen: benighn, BS positve, no tenderness, no AAA no bruit.  No HSM or HJR Distal pulses intact with no bruits No edema Neuro non-focal Skin warm and dry No muscular weakness   Anemia Panel: No results for input(s): VITAMINB12, FOLATE, FERRITIN, TIBC, IRON, RETICCTPCT in the last 72 hours.  Imaging: No results found.  Cardiac Studies:  ECG:  SR nonspecific ST chagnes    Telemetry:  NSR no arrhythmia   Echo: pending   Medications:   . diphenhydrAMINE  25 mg Oral QHS  . enoxaparin (LOVENOX) injection  40 mg Subcutaneous Q24H     . sodium chloride      Assessment/Plan:  Chest Pain :  R/O no acute ECG changes Family history Discussed with girlfriend and daughter Will risk stratify with exercise myovue today     Charlton Hawseter Andree Golphin 12/12/2016, 7:55 AM

## 2016-12-12 NOTE — Discharge Instructions (Signed)
Nonspecific Chest Pain Chest pain can be caused by many different conditions. There is a chance that your pain could be related to something serious, such as a heart attack or a blood clot in your lungs. Chest pain can also be caused by conditions that are not life-threatening. If you have chest pain, it is very important to follow up with your doctor. Follow these instructions at home: Medicines  If you were prescribed an antibiotic medicine, take it as told by your doctor. Do not stop taking the antibiotic even if you start to feel better.  Take over-the-counter and prescription medicines only as told by your doctor. Lifestyle  Do not use any products that contain nicotine or tobacco, such as cigarettes and e-cigarettes. If you need help quitting, ask your doctor.  Do not drink alcohol.  Make lifestyle changes as told by your doctor. These may include:  Getting regular exercise. Ask your doctor for some activities that are safe for you.  Eating a heart-healthy diet. A diet specialist (dietitian) can help you to learn healthy eating options.  Staying at a healthy weight.  Managing diabetes, if needed.  Lowering your stress, as with deep breathing or spending time in nature. General instructions  Avoid any activities that make you feel chest pain.  If your chest pain is because of heartburn:  Raise (elevate) the head of your bed about 6 inches (15 cm). You can do this by putting blocks under the bed legs at the head of the bed.  Do not sleep with extra pillows under your head. That does not help heartburn.  Keep all follow-up visits as told by your doctor. This is important. This includes any further testing if your chest pain does not go away. Contact a doctor if:  Your chest pain does not go away.  You have a rash with blisters on your chest.  You have a fever.  You have chills. Get help right away if:  Your chest pain is worse.  You have a cough that gets worse, or  you cough up blood.  You have very bad (severe) pain in your belly (abdomen).  You are very weak.  You pass out (faint).  You have either of these for no clear reason:  Sudden chest discomfort.  Sudden discomfort in your arms, back, neck, or jaw.  You have shortness of breath at any time.  You suddenly start to sweat, or your skin gets clammy.  You feel sick to your stomach (nauseous).  You throw up (vomit).  You suddenly feel light-headed or dizzy.  Your heart starts to beat fast, or it feels like it is skipping beats. These symptoms may be an emergency. Do not wait to see if the symptoms will go away. Get medical help right away. Call your local emergency services (911 in the [place] Do not drive yourself to the hospital.  This information is not intended to replace advice given to you by your health care provider. Make sure you discuss any questions you have with your health care provider. Document Released: 03/29/2008 Document Revised: 07/05/2016 Document Reviewed: 07/05/2016 Elsevier Interactive Patient Education  2017 [employer]  Cholesterol Cholesterol is a white, waxy, fat-like substance that is needed by the human body in small amounts. The liver makes all the cholesterol we need. Cholesterol is carried from the liver by the blood through the blood vessels. Deposits of cholesterol (plaques) may build up on blood vessel (artery) walls. Plaques make the arteries narrower and stiffer.  Cholesterol plaques increase the risk for heart attack and stroke. You cannot feel your cholesterol level even if it is very high. The only way to know that it is high is to have a blood test. Once you know your cholesterol levels, you should keep a record of the test results. Work with your health care provider to keep your levels in the desired range. What do the results mean?  Total cholesterol is a rough measure of all the cholesterol in your blood.  LDL (low-density lipoprotein) is the  bad cholesterol. This is the type that causes plaque to build up on the artery walls. You want this level to be low.  HDL (high-density lipoprotein) is the good cholesterol because it cleans the arteries and carries the LDL away. You want this level to be high.  Triglycerides are fat that the body can either burn for energy or store. High levels are closely linked to heart disease. What are the desired levels of cholesterol?  Total cholesterol below 200.  LDL below 100 for people who are at risk, below 70 for people at very high risk.  HDL above 40 is good. A level of 60 or higher is considered to be protective against heart disease.  Triglycerides below 150. How can I lower my cholesterol? Diet  Follow your diet program as told by your health care provider.  Choose fish or white meat chicken and [place], roasted or baked. Limit fatty cuts of red meat, fried foods, and processed meats, such as sausage and lunch meats.  Eat lots of fresh fruits and vegetables.  Choose whole grains, beans, pasta, potatoes, and cereals.  Choose olive oil, corn oil, or canola oil, and use only small amounts.  Avoid butter, mayonnaise, shortening, or palm kernel oils.  Avoid foods with trans fats.  Drink skim or nonfat milk and eat low-fat or nonfat yogurt and cheeses. Avoid whole milk, cream, ice cream, egg yolks, and full-fat cheeses.  Healthier desserts include angel food cake, ginger snaps, animal crackers, hard candy, popsicles, and low-fat or nonfat frozen yogurt. Avoid pastries, cakes, pies, and cookies. Exercise  Follow your exercise program as told by your health care provider. A regular program:  Helps to decrease LDL and raise HDL.  Helps with weight control.  Do things that increase your activity level, such as gardening, walking, and taking the stairs.  Ask your health care provider about ways that you can be more active in your daily life. Medicine  Take over-the-counter and  prescription medicines only as told by your health care provider.  Medicine may be prescribed by your health care provider to help lower cholesterol and decrease the risk for heart disease. This is usually done if diet and exercise have failed to bring down cholesterol levels.  If you have several risk factors, you may need medicine even if your levels are normal. This information is not intended to replace advice given to you by your health care provider. Make sure you discuss any questions you have with your health care provider. Document Released: 07/06/2001 Document Revised: 05/08/2016 Document Reviewed: 04/10/2016 [employer] Interactive Patient Education  2017 [employer]

## 2016-12-13 LAB — HEMOGLOBIN A1C
Hgb A1c MFr Bld: 5.6 % (ref 4.8–5.6)
MEAN PLASMA GLUCOSE: 114 mg/dL

## 2022-11-08 DIAGNOSIS — R350 Frequency of micturition: Secondary | ICD-10-CM | POA: Diagnosis not present

## 2022-11-08 DIAGNOSIS — Z299 Encounter for prophylactic measures, unspecified: Secondary | ICD-10-CM | POA: Diagnosis not present

## 2023-08-24 DIAGNOSIS — Z7189 Other specified counseling: Secondary | ICD-10-CM | POA: Diagnosis not present

## 2023-08-24 DIAGNOSIS — K4090 Unilateral inguinal hernia, without obstruction or gangrene, not specified as recurrent: Secondary | ICD-10-CM | POA: Diagnosis not present

## 2023-08-24 DIAGNOSIS — Z299 Encounter for prophylactic measures, unspecified: Secondary | ICD-10-CM | POA: Diagnosis not present

## 2023-08-24 DIAGNOSIS — Z0000 Encounter for general adult medical examination without abnormal findings: Secondary | ICD-10-CM | POA: Diagnosis not present

## 2023-08-25 DIAGNOSIS — Z0000 Encounter for general adult medical examination without abnormal findings: Secondary | ICD-10-CM | POA: Diagnosis not present

## 2023-08-25 DIAGNOSIS — Z79899 Other long term (current) drug therapy: Secondary | ICD-10-CM | POA: Diagnosis not present

## 2024-01-31 DIAGNOSIS — M549 Dorsalgia, unspecified: Secondary | ICD-10-CM | POA: Diagnosis not present

## 2024-01-31 DIAGNOSIS — R52 Pain, unspecified: Secondary | ICD-10-CM | POA: Diagnosis not present

## 2024-01-31 DIAGNOSIS — Z0000 Encounter for general adult medical examination without abnormal findings: Secondary | ICD-10-CM | POA: Diagnosis not present

## 2024-01-31 DIAGNOSIS — Z299 Encounter for prophylactic measures, unspecified: Secondary | ICD-10-CM | POA: Diagnosis not present
